# Patient Record
Sex: Male | Born: 1945 | Race: White | Hispanic: No | Marital: Married | Smoking: Never smoker
Health system: Southern US, Community
[De-identification: ages and names within clinical notes are randomized; demographics above are authoritative.]

---

## 2003-02-19 ENCOUNTER — Encounter: Admission: RE | Admit: 2003-02-19 | Discharge: 2003-02-19 | Payer: Self-pay | Admitting: Internal Medicine

## 2004-04-15 ENCOUNTER — Ambulatory Visit: Payer: Self-pay | Admitting: Internal Medicine

## 2004-11-18 IMAGING — CT CT CHEST W/ CM
1 of 2 series · 11 of 30 positions shown, 14 images · IV contrast (OMNIPAQUE [ID])
Comparison: none

CLINICAL DATA: Left side and chest pain.  Shortness of breath.  Creatinine 1.3.  TYM-ND.  [id number].  
 CT OF THE CHEST WITH CONTRAST
 Pulmonary embolus protocol. 
 After the intravenous injection of 150 cc of Omnipaque 300 a series of scans of the entire chest are made and show no definite pulmonary emboli.  The heart appears normal.  There is no pericardial or pleural effusion.  The mediastinum appears normal with no abnormal lymph nodes or masses.  The lungs show no evidence of acute infiltrate, consolidation, atelectasis or neoplasm. 
 The bones show degenerative hypertrophic spurs of the mid and lower lumbar spine with no evidence of metastatic disease.  The upper abdominal structures are normal as far as can be seen.  
 IMPRESSION
 No evidence of pulmonary emboli.  Degenerative hypertrophic changes.  Mid and lower thoracic spine.  No evidence of acute disease within the lungs.

[Series 2: pe study · axial · 0.70mm/px · z∈[-230,-4]mm · 11 of 111 slices shown, 14 images]
[im 11/111  mediastinal]
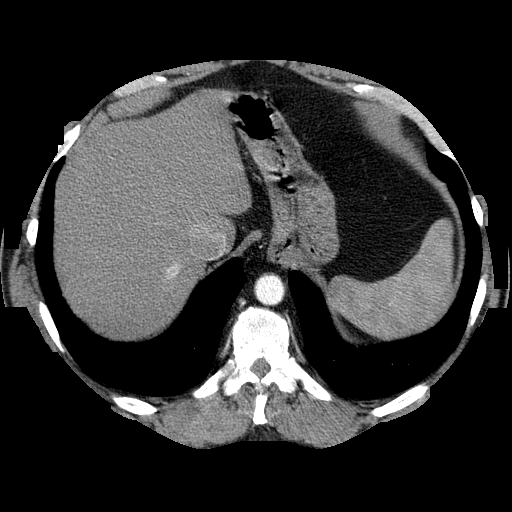
[im 11/111  lung]
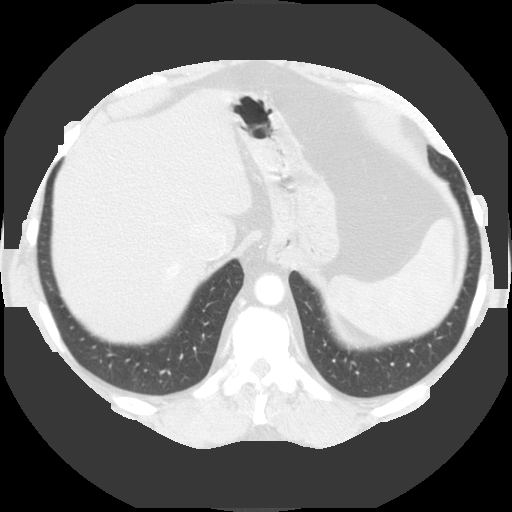
[im 21/111  lung]
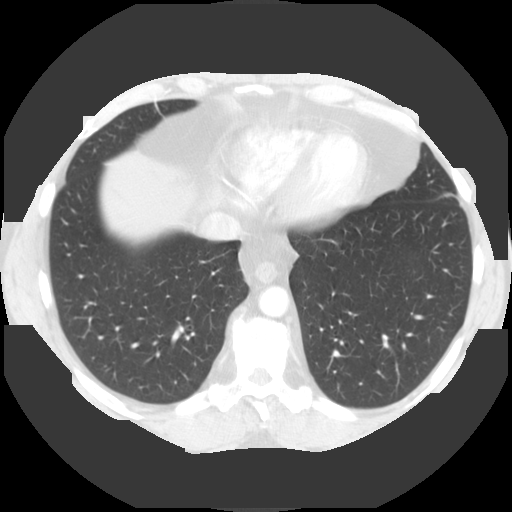
[im 31/111  lung]
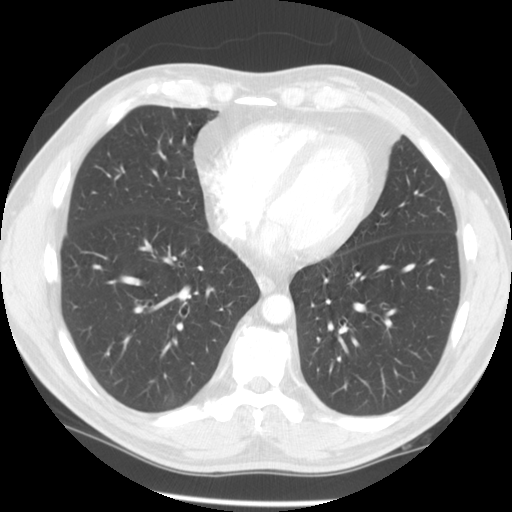
[im 41/111  lung]
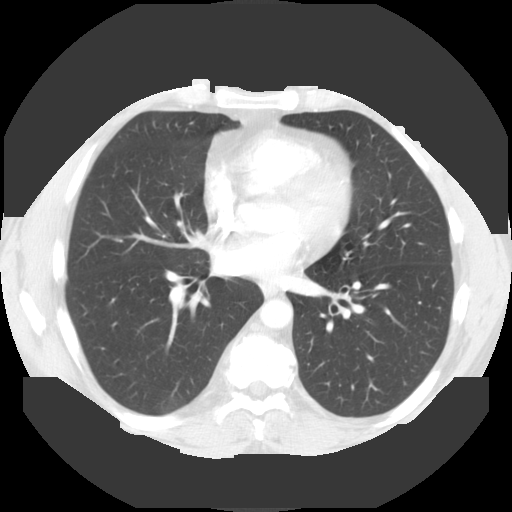
[im 51/111  mediastinal]
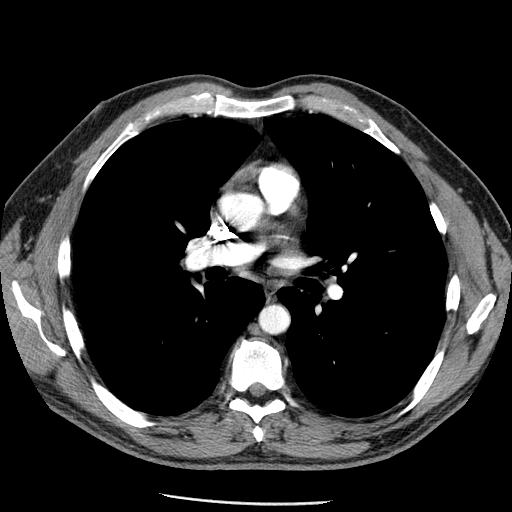
[im 51/111  lung]
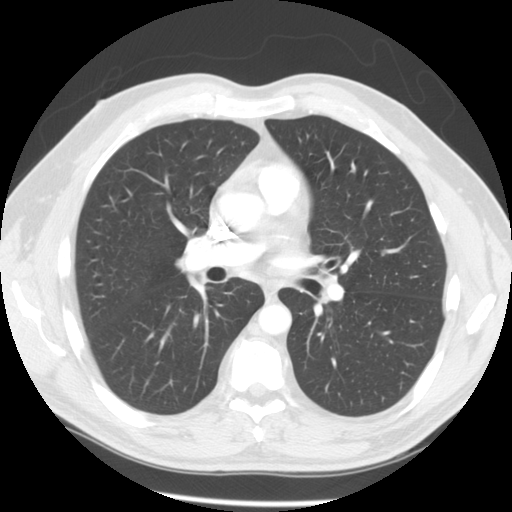
[im 52/111  lung]
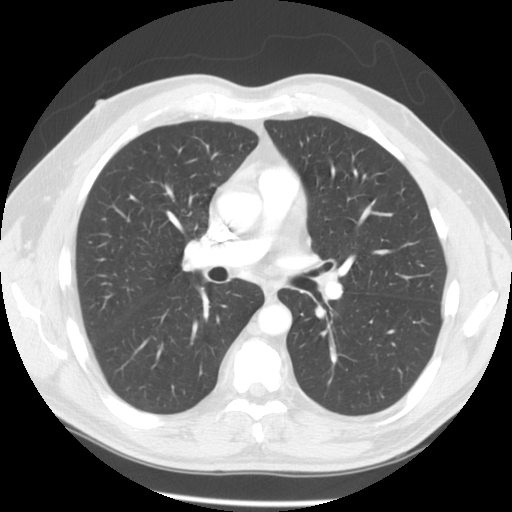
[im 61/111  lung]
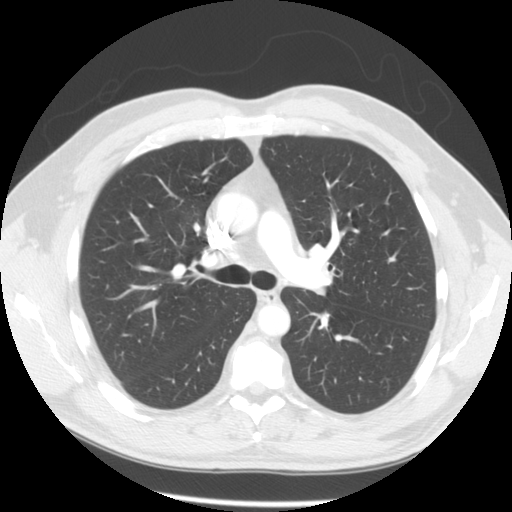
[im 71/111  lung]
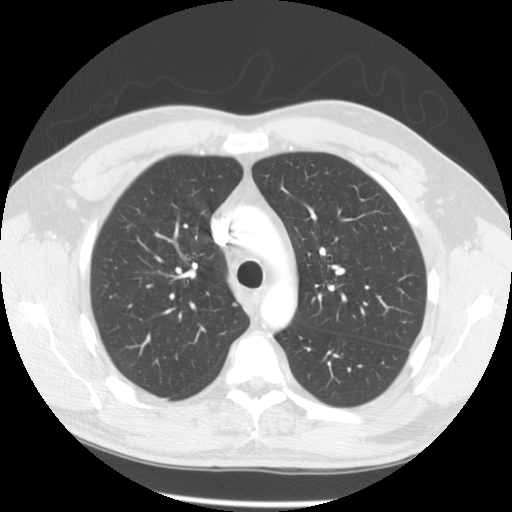
[im 81/111  mediastinal]
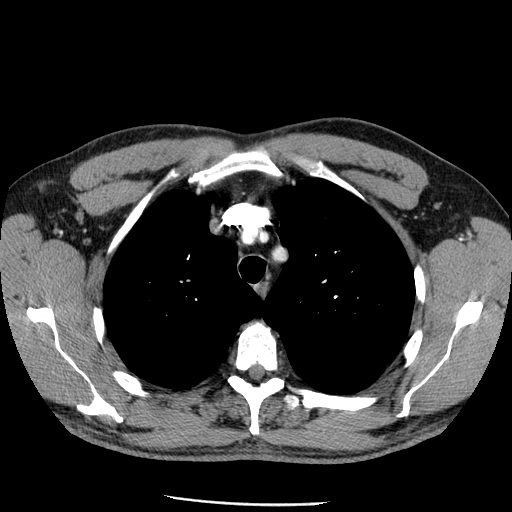
[im 81/111  lung]
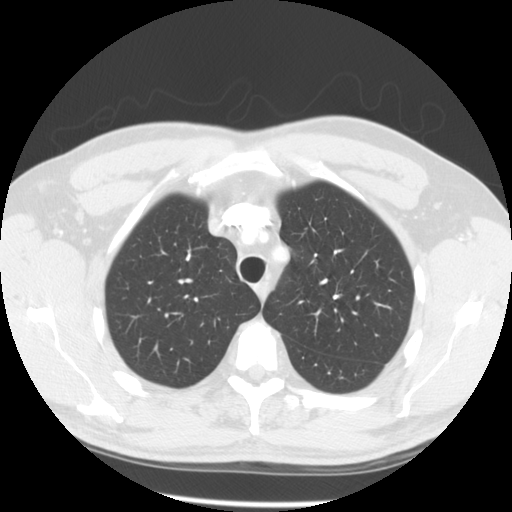
[im 91/111  lung]
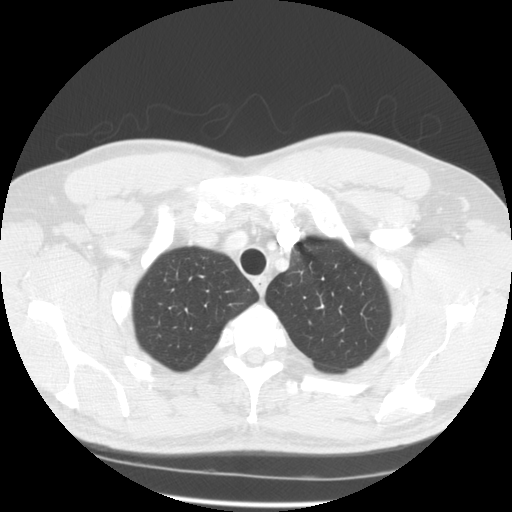
[im 101/111  lung]
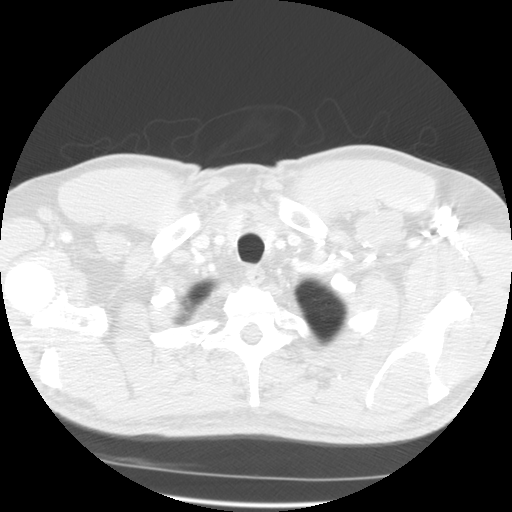

[11 of 30 positions shown; findings below may reference images not displayed]

## 2020-10-03 ENCOUNTER — Ambulatory Visit: Payer: Medicare Other | Admitting: Behavioral Health

## 2020-10-16 ENCOUNTER — Other Ambulatory Visit: Payer: Self-pay

## 2020-10-16 ENCOUNTER — Encounter: Payer: Self-pay | Admitting: Behavioral Health

## 2020-10-16 ENCOUNTER — Ambulatory Visit: Payer: Medicare Other | Admitting: Behavioral Health

## 2020-10-16 VITALS — BP 145/75 | HR 70 | Ht 68.0 in | Wt 204.0 lb

## 2020-10-16 DIAGNOSIS — F411 Generalized anxiety disorder: Secondary | ICD-10-CM | POA: Diagnosis not present

## 2020-10-16 DIAGNOSIS — F330 Major depressive disorder, recurrent, mild: Secondary | ICD-10-CM

## 2020-10-16 MED ORDER — SERTRALINE HCL 50 MG PO TABS: ORAL_TABLET | ORAL | 1 refills | Status: DC

## 2020-10-16 NOTE — Progress Notes (Signed)
[facility] MD/PA/NP Initial Note  10/16/2020 12:36 PM Ricky Wells  MRN:  [id number]  Chief Complaint:  Chief Complaint   Anxiety; Depression; Establish Care; Patient Education     HPI:  75 year old male presents to this office for initial visit and to establish care. He says  that he has struggled with anxiety and depression since he was in his twenties but has just managed through life the best he can. Ricky Wells that he experienced death of his brother  through drug addiction and then lost his son last November to methamphetamine use. He tearfully says that was tough for him.  He says he would like to get script for Ativan because he had done some research.  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 Spending all the money is stressful he says. He reports his anxiety today at 4/10 and depression at 3/10. He says he does sleep 7 hours per night . Says he does not like taking medication but he feels like its to the point where he needs the extra help with his nerves sometimes.  No mania, no psychosis. No SI/HI.  Prior psychiatric medication trials Effexor: Pt says that he never picked up script and did not want to take. PCP prescribed.   Visit Diagnosis:    ICD-10-CM   1. Generalized anxiety disorder  F41.1 sertraline (ZOLOFT) 50 MG tablet    2. Mild episode of recurrent major depressive disorder (HCC)  F33.0 sertraline (ZOLOFT) 50 MG tablet      Past Psychiatric History: PCP  Past Medical History: History reviewed. No pertinent past medical history. History reviewed. No pertinent surgical history.  Family Psychiatric History: see chart  Family History:  Family History  Problem Relation Age of Onset   Drug abuse Brother    Drug abuse Child     Social History:  Social History   Socioeconomic History   Marital status: Married    Spouse name: Not on file   Number of children: 3   Years of education:  12   Highest education level: High school graduate  Occupational History   Not on file  Tobacco Use   Smoking status: Never   Smokeless tobacco: Never  Substance and Sexual Activity   Alcohol use: Not Currently   Drug use: Never   Sexual activity: Yes    Comment: married  Other Topics Concern   Not on file  Social History Narrative   Lives with wife in [place]. 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   Social Determinants of Health   Financial Resource Strain: Not on file  Food Insecurity: Not on file  Transportation Needs: Not on file  Physical Activity: Not on file  Stress: Not on file  Social Connections: Not on file    Allergies: No Known Allergies  Metabolic Disorder Labs: No results found for: HGBA1C, MPG No results found for: PROLACTIN No results found for: CHOL, TRIG, HDL, CHOLHDL, VLDL, LDLCALC No results found for: TSH  Therapeutic Level Labs: No results found for: LITHIUM No results found for: VALPROATE No components found for:  CBMZ  Current Medications: Current Outpatient Medications  Medication Sig Dispense Refill   pantoprazole (PROTONIX) 20 MG tablet Take 2 tablets by mouth daily.     Red Yeast Rice Extract 600 MG CAPS See admin instructions.     sertraline (ZOLOFT) 50 MG tablet  Take 1/2 tablet 25 mg for 7 days, and then take one whole tablet 50 mg daily. 30 tablet 1   No current facility-administered medications for this visit.    Medication Side Effects: anxiety  Orders placed this visit:  No orders of the defined types were placed in this encounter.   Psychiatric Specialty Exam:  Review of Systems  Constitutional: Negative.   Genitourinary:  Positive for urgency.  Musculoskeletal:  Positive for back pain.  Allergic/Immunologic: Negative.   Neurological: Negative.   Psychiatric/Behavioral:  Positive for dysphoric mood. The patient is nervous/anxious.    Blood pressure (!) 145/75, pulse 70, height  5\' 8"  (1.727 m), weight 204 lb (92.5 kg).Body mass index is 31.02 kg/m.  General Appearance: Casual, Neat, and Well Groomed  Eye Contact:  Good  Speech:  Clear and Coherent  Volume:  Normal  Mood:  Anxious  Affect:  Appropriate  Thought Process:  Coherent  Orientation:  Full (Time, Place, and Person)  Thought Content: Logical   Suicidal Thoughts:  No  Homicidal Thoughts:  No  Memory:  WNL  Judgement:  Good  Insight:  Fair  Psychomotor Activity:  Decreased  Concentration:  Concentration: Good  Recall:  Good  Fund of Knowledge: Fair  Language: Good  Assets:  Desire for Improvement Resilience Social Support  ADL's:  Intact  Cognition: WNL  Prognosis:  Good   Screenings:  GAD-7    Flowsheet Row Office Visit from 10/16/2020 in [facility]  Total GAD-7 Score 10      PHQ2-9    Flowsheet Row Office Visit from 10/16/2020 in [facility]  PHQ-2 Total Score 1       Receiving Psychotherapy: No   Treatment Plan/Recommendations:   To start zoloft 1/2 tablet 25 mg for 7 days, then one whole tablet 50 mg daily. Report side effects or worsening symptoms promptly Provided emergency contact information To follow up in 4 weeks to reassess. Greater than 50% of 60 min face to face time with patient was spent on counseling and coordination of care. We discussed his long history of anxiety and depression. Discuss his family loss and grief. Educated patient that benzodiazapine's was first line tx for anxiety and depression and risk due to his advanced age. Explained that it was not a long term therapy option. Patients symptoms are considered mild at this time. He agreed to try SSRI and take exactly as prescribed until next visit.    10/18/2020, NP

## 2020-11-13 ENCOUNTER — Ambulatory Visit (INDEPENDENT_AMBULATORY_CARE_PROVIDER_SITE_OTHER): Payer: Medicare Other | Admitting: Behavioral Health

## 2020-11-13 ENCOUNTER — Other Ambulatory Visit: Payer: Self-pay

## 2020-11-13 ENCOUNTER — Encounter: Payer: Self-pay | Admitting: Behavioral Health

## 2020-11-13 DIAGNOSIS — F411 Generalized anxiety disorder: Secondary | ICD-10-CM | POA: Diagnosis not present

## 2020-11-13 DIAGNOSIS — F330 Major depressive disorder, recurrent, mild: Secondary | ICD-10-CM | POA: Diagnosis not present

## 2020-11-13 MED ORDER — SERTRALINE HCL 50 MG PO TABS: ORAL_TABLET | ORAL | 3 refills | Status: AC

## 2020-11-13 NOTE — Progress Notes (Signed)
[facility] Med Check  Patient ID: Ricky Wells,  MRN: [id number]  PCP: Raynelle Jan., MD  Date of Evaluation: 11/13/2020 Time spent:20 minutes  Chief Complaint:  Chief Complaint   Depression; Anxiety; Medication Problem; Medication Refill; Follow-up     HISTORY/CURRENT STATUS: HPI 75 year old male presents to this office for follow up and medication management. Says that he was not able to stay on the Zoloft 50 mg but for a few days because it made him feel fatigued. He says he ended up backing back down to 25 mg. He says that he has about 50% improvement with is anxiety and depression and would like to retry taking 50 mg of Zoloft again. He reports his anxiety today at 3/10 and depression 2/10. He is sleeping 7 hours per night on average. No mania, no psychosis. No SI/HI.   Prior psychiatric medication trials Effexor: Pt says that he never picked up script and did not want to take. PCP prescribed  Individual Medical History/ Review of Systems: Changes? :No   Allergies: Patient has no known allergies.  Current Medications:  Current Outpatient Medications:    pantoprazole (PROTONIX) 20 MG tablet, Take 2 tablets by mouth daily., Disp: , Rfl:    Red Yeast Rice Extract 600 MG CAPS, See admin instructions., Disp: , Rfl:    sertraline (ZOLOFT) 50 MG tablet, Take one tablet daily., Disp: 30 tablet, Rfl: 3 Medication Side Effects: none  Family Medical/ Social History: Changes? No  MENTAL HEALTH EXAM:  There were no vitals taken for this visit.There is no height or weight on file to calculate BMI.  General Appearance: Casual, Neat, and Well Groomed  Eye Contact:  Good  Speech:  Clear and Coherent  Volume:  Normal  Mood:  Anxious  Affect:  Appropriate  Thought Process:  Coherent  Orientation:  Full (Time, Place, and Person)  Thought Content: Logical   Suicidal Thoughts:  No  Homicidal Thoughts:  No  Memory:  WNL  Judgement:  Good  Insight:  Good  Psychomotor Activity:   Normal  Concentration:  Concentration: Good  Recall:  Good  Fund of Knowledge: Good  Language: Good  Assets:  Desire for Improvement  ADL's:  Intact  Cognition: WNL  Prognosis:  Good    DIAGNOSES:    ICD-10-CM   1. Generalized anxiety disorder  F41.1 sertraline (ZOLOFT) 50 MG tablet    2. Mild episode of recurrent major depressive disorder (HCC)  F33.0 sertraline (ZOLOFT) 50 MG tablet      Receiving Psychotherapy: No    RECOMMENDATIONS:   Continue 50 mg tablet daily Report side effects or worsening symptoms promptly Provided emergency contact information To follow up in 4 weeks to reassess. Greater than 50% of 20 min face to face time with patient was spent on counseling and coordination of care. We discussed his recent improvements  anxiety and depression. He feels like he has improved about 50 %. Discuss further his family loss and grief. Educated patient that benzodiazapine's was not  first line tx for anxiety and depression and risk due to his advanced age. Explained that it was not a long term therapy option. Patients symptoms are considered mild at this time. He agreed to try SSRI and take exactly as prescribed until next visit. Explained to Pt that some of the side effects such as fatigue can improve with time after adjusting to medication.    Joan Flores, NP

## 2021-01-20 ENCOUNTER — Ambulatory Visit: Payer: Medicare Other | Admitting: Behavioral Health

## 2021-01-22 ENCOUNTER — Ambulatory Visit: Payer: Medicare Other | Admitting: Behavioral Health
